# Patient Record
Sex: Female | Born: 1960 | Race: White | Hispanic: No | Smoking: Never smoker
Health system: Southern US, Community
[De-identification: ages and names within clinical notes are randomized; demographics above are authoritative.]

## PROBLEM LIST (undated history)

## (undated) DIAGNOSIS — K219 Gastro-esophageal reflux disease without esophagitis: Secondary | ICD-10-CM

## (undated) DIAGNOSIS — R51 Headache: Secondary | ICD-10-CM

## (undated) HISTORY — PX: CHOLECYSTECTOMY: SHX55

---

## 1997-08-08 ENCOUNTER — Ambulatory Visit (HOSPITAL_COMMUNITY): Admission: RE | Admit: 1997-08-08 | Discharge: 1997-08-08 | Payer: Self-pay | Admitting: Family Medicine

## 1999-09-03 ENCOUNTER — Other Ambulatory Visit: Admission: RE | Admit: 1999-09-03 | Discharge: 1999-09-03 | Payer: Self-pay | Admitting: General Practice

## 2001-10-22 ENCOUNTER — Encounter: Payer: Self-pay | Admitting: Emergency Medicine

## 2001-10-22 ENCOUNTER — Emergency Department (HOSPITAL_COMMUNITY): Admission: EM | Admit: 2001-10-22 | Discharge: 2001-10-22 | Payer: Self-pay | Admitting: Emergency Medicine

## 2001-11-23 ENCOUNTER — Other Ambulatory Visit: Admission: RE | Admit: 2001-11-23 | Discharge: 2001-11-23 | Payer: Self-pay | Admitting: Family Medicine

## 2003-11-18 ENCOUNTER — Ambulatory Visit (HOSPITAL_COMMUNITY): Admission: RE | Admit: 2003-11-18 | Discharge: 2003-11-18 | Payer: Self-pay | Admitting: General Practice

## 2003-11-18 ENCOUNTER — Other Ambulatory Visit: Admission: RE | Admit: 2003-11-18 | Discharge: 2003-11-18 | Payer: Self-pay | Admitting: Obstetrics and Gynecology

## 2004-11-20 ENCOUNTER — Ambulatory Visit (HOSPITAL_COMMUNITY): Admission: RE | Admit: 2004-11-20 | Discharge: 2004-11-20 | Payer: Self-pay | Admitting: Obstetrics and Gynecology

## 2004-11-20 ENCOUNTER — Other Ambulatory Visit: Admission: RE | Admit: 2004-11-20 | Discharge: 2004-11-20 | Payer: Self-pay | Admitting: Obstetrics and Gynecology

## 2005-11-22 ENCOUNTER — Ambulatory Visit (HOSPITAL_COMMUNITY): Admission: RE | Admit: 2005-11-22 | Discharge: 2005-11-22 | Payer: Self-pay | Admitting: Obstetrics and Gynecology

## 2006-09-27 ENCOUNTER — Ambulatory Visit: Payer: Self-pay | Admitting: Gastroenterology

## 2006-09-27 LAB — CONVERTED CEMR LAB
ALT: 12 units/L (ref 0–40)
AST: 14 units/L (ref 0–37)
Albumin: 4.1 g/dL (ref 3.5–5.2)
Alkaline Phosphatase: 49 units/L (ref 39–117)
Amylase: 95 units/L (ref 27–131)
BUN: 6 mg/dL (ref 6–23)
Basophils Absolute: 0 10*3/uL (ref 0.0–0.1)
Basophils Relative: 0.6 % (ref 0.0–1.0)
Bilirubin, Direct: 0.2 mg/dL (ref 0.0–0.3)
CO2: 30 meq/L (ref 19–32)
Calcium: 9.2 mg/dL (ref 8.4–10.5)
Chloride: 108 meq/L (ref 96–112)
Creatinine, Ser: 0.6 mg/dL (ref 0.4–1.2)
Eosinophils Absolute: 0 10*3/uL (ref 0.0–0.6)
Eosinophils Relative: 0.5 % (ref 0.0–5.0)
GFR calc Af Amer: 140 mL/min
GFR calc non Af Amer: 116 mL/min
Glucose, Bld: 93 mg/dL (ref 70–99)
HCT: 39.2 % (ref 36.0–46.0)
Hemoglobin: 13.8 g/dL (ref 12.0–15.0)
Lipase: 28 units/L (ref 11.0–59.0)
Lymphocytes Relative: 22.5 % (ref 12.0–46.0)
MCHC: 35.3 g/dL (ref 30.0–36.0)
MCV: 88.3 fL (ref 78.0–100.0)
Monocytes Absolute: 0.2 10*3/uL (ref 0.2–0.7)
Monocytes Relative: 3.1 % (ref 3.0–11.0)
Neutro Abs: 4.9 10*3/uL (ref 1.4–7.7)
Neutrophils Relative %: 73.3 % (ref 43.0–77.0)
Platelets: 213 10*3/uL (ref 150–400)
Potassium: 4 meq/L (ref 3.5–5.1)
RBC: 4.44 M/uL (ref 3.87–5.11)
RDW: 12.5 % (ref 11.5–14.6)
Sodium: 145 meq/L (ref 135–145)
TSH: 0.11 microintl units/mL — ABNORMAL LOW (ref 0.35–5.50)
Total Bilirubin: 1 mg/dL (ref 0.3–1.2)
Total Protein: 7.5 g/dL (ref 6.0–8.3)
WBC: 6.6 10*3/uL (ref 4.5–10.5)

## 2006-10-04 ENCOUNTER — Ambulatory Visit: Payer: Self-pay | Admitting: Gastroenterology

## 2006-10-11 ENCOUNTER — Ambulatory Visit: Payer: Self-pay | Admitting: Gastroenterology

## 2006-10-11 LAB — CONVERTED CEMR LAB
Free T4: 0.7 ng/dL (ref 0.6–1.6)
T3 Uptake Ratio: 40.5 % — ABNORMAL HIGH (ref 22.5–37.0)
T3, Free: 2.8 pg/mL (ref 2.3–4.2)
T4, Total: 5.7 ug/dL (ref 5.0–12.5)

## 2006-10-18 ENCOUNTER — Ambulatory Visit: Payer: Self-pay | Admitting: Gastroenterology

## 2006-11-15 ENCOUNTER — Ambulatory Visit (HOSPITAL_COMMUNITY): Admission: RE | Admit: 2006-11-15 | Discharge: 2006-11-16 | Payer: Self-pay | Admitting: General Surgery

## 2006-11-15 ENCOUNTER — Encounter (INDEPENDENT_AMBULATORY_CARE_PROVIDER_SITE_OTHER): Payer: Self-pay | Admitting: General Surgery

## 2007-07-07 ENCOUNTER — Emergency Department (HOSPITAL_COMMUNITY): Admission: EM | Admit: 2007-07-07 | Discharge: 2007-07-07 | Payer: Self-pay | Admitting: Emergency Medicine

## 2007-08-19 DIAGNOSIS — R5381 Other malaise: Secondary | ICD-10-CM

## 2007-08-19 DIAGNOSIS — R630 Anorexia: Secondary | ICD-10-CM

## 2007-08-19 DIAGNOSIS — R5383 Other fatigue: Secondary | ICD-10-CM

## 2007-08-19 DIAGNOSIS — R112 Nausea with vomiting, unspecified: Secondary | ICD-10-CM

## 2007-08-19 DIAGNOSIS — G4700 Insomnia, unspecified: Secondary | ICD-10-CM | POA: Insufficient documentation

## 2007-08-19 DIAGNOSIS — F341 Dysthymic disorder: Secondary | ICD-10-CM

## 2007-08-19 DIAGNOSIS — R17 Unspecified jaundice: Secondary | ICD-10-CM | POA: Insufficient documentation

## 2007-08-19 DIAGNOSIS — R1032 Left lower quadrant pain: Secondary | ICD-10-CM

## 2008-09-13 ENCOUNTER — Emergency Department (HOSPITAL_COMMUNITY): Admission: EM | Admit: 2008-09-13 | Discharge: 2008-09-13 | Payer: Self-pay | Admitting: Emergency Medicine

## 2008-09-13 ENCOUNTER — Emergency Department (HOSPITAL_COMMUNITY): Admission: EM | Admit: 2008-09-13 | Discharge: 2008-09-14 | Payer: Self-pay | Admitting: Emergency Medicine

## 2010-08-04 LAB — COMPREHENSIVE METABOLIC PANEL
ALT: 12 U/L (ref 0–35)
AST: 20 U/L (ref 0–37)
Alkaline Phosphatase: 40 U/L (ref 39–117)
Alkaline Phosphatase: 53 U/L (ref 39–117)
BUN: 6 mg/dL (ref 6–23)
CO2: 29 mEq/L (ref 19–32)
CO2: 21 mEq/L (ref 19–32)
Calcium: 8.7 mg/dL (ref 8.4–10.5)
Chloride: 109 mEq/L (ref 96–112)
GFR calc Af Amer: >60 mL/min (ref >60)
GFR calc non Af Amer: >60 mL/min (ref >60)
Glucose, Bld: 97 mg/dL (ref 70–99)
Potassium: 3.6 mEq/L (ref 3.5–5.1)
Potassium: 3.3 mEq/L — ABNORMAL LOW (ref 3.5–5.1)
Sodium: 139 mEq/L (ref 135–145)
Total Bilirubin: 3.1 mg/dL — ABNORMAL HIGH (ref 0.3–1.2)
Total Protein: 6.9 g/dL (ref 6.0–8.3)

## 2010-08-04 LAB — CBC
HCT: 39.1 % (ref 36.0–46.0)
Hemoglobin: 12.9 g/dL (ref 12.0–15.0)
MCHC: 35.5 g/dL (ref 30.0–36.0)
RBC: 4.2 MIL/uL (ref 3.87–5.11)
WBC: 9.3 10*3/uL (ref 4.0–10.5)
WBC: 7.2 10*3/uL (ref 4.0–10.5)

## 2010-08-04 LAB — DIFFERENTIAL
Basophils Absolute: 0 10*3/uL (ref 0.0–0.1)
Basophils Relative: 0 % (ref 0–1)
Eosinophils Absolute: 0 10*3/uL (ref 0.0–0.7)
Eosinophils Relative: 0 % (ref 0–5)
Lymphs Abs: 0.4 10*3/uL — ABNORMAL LOW (ref 0.7–4.0)
Monocytes Absolute: 0.1 10*3/uL (ref 0.1–1.0)
Monocytes Relative: 1 % — ABNORMAL LOW (ref 3–12)
Neutro Abs: 6.8 10*3/uL (ref 1.7–7.7)
Neutrophils Relative %: 94 % — ABNORMAL HIGH (ref 43–77)

## 2010-08-04 LAB — URINALYSIS, ROUTINE W REFLEX MICROSCOPIC
Bilirubin Urine: NEGATIVE
Glucose, UA: NEGATIVE mg/dL
Hgb urine dipstick: NEGATIVE
Ketones, ur: NEGATIVE mg/dL
Ketones, ur: >80 mg/dL — AB
Protein, ur: 30 mg/dL — AB
Protein, ur: NEGATIVE mg/dL
Urobilinogen, UA: 1 mg/dL (ref 0.0–1.0)

## 2010-08-04 LAB — POCT PREGNANCY, URINE: Preg Test, Ur: NEGATIVE

## 2010-08-04 LAB — URINE MICROSCOPIC-ADD ON

## 2010-08-04 LAB — POCT CARDIAC MARKERS: Troponin i, poc: <0.05 ng/mL (ref 0.00–0.09)

## 2010-08-04 LAB — PREGNANCY, URINE: Preg Test, Ur: NEGATIVE

## 2010-08-04 LAB — LIPASE, BLOOD: Lipase: 27 U/L (ref 11–59)

## 2010-09-08 NOTE — Assessment & Plan Note (Signed)
[facility]                         GASTROENTEROLOGY OFFICE NOTE   MADALYN, LEGNER                      MRN:          [id number]  DATE:09/27/2006                            DOB:          02-25-1961    NEW PATIENT EVALUATION/CONSULT   CHIEF COMPLAINT:  Mrs. Takiyah is a 50 year old [place] female referred  for evaluation of abdominal pain and elevated bilirubin on lab testing.   HISTORY OF PRESENT ILLNESS:  Mrs. Sapphire has been in good health all of  her life without serious medical or surgical problems.  Her husband died  unexpectedly 2 months ago and she has had resulting anxiety and  depression with loss of sleep, loss of appetite, anorexia, and general  malaise.  Approximately a week ago, she developed crampy abdominal pain  with nausea and vomiting and a low grade fever.  She was seen by Dr.  Louanna Raw on Sep 07, 2006 and had a normal CBC and metabolic profile,  except for an elevated total bilirubin of 2.7 mg percent.  Apparently,  KUB showed excessive amounts of stool in the abdomen, but otherwise was  normal, as was EKG and chest x-ray.  The patient was placed on  dicyclomine 10 mg t.i.d., which she has been unable to tolerate, also  daily Senokot.   She continues to complain of a crampy lower abdominal pain, some mild  constipation, but no rectal bleeding, no further nausea and vomiting, or  systemic complaints.  She denies any specific hepatobiliary problems  such as clay-colored stools, dark urine, icterus, fever, or chills.  There is no history of hepatitis in the past or known liver disease.  She is not an alcoholic and does not abuse cigarettes or NSAID.  She has  never had to have prior GI evaluations for any reasons.   PAST MEDICAL HISTORY:  The patient otherwise has no medical history what  so ever.  She was born in [place], but has lived the last 10 years in  [place] and [place].  She denies any specific food  intolerances.   FAMILY HISTORY:  Entirely noncontributory.   SOCIAL HISTORY:  The patient is widowed and lives with her children.  She has a high school education.  She does not smoke or use alcohol and  gives no history of IV needle use.   REVIEW OF SYSTEMS:  Otherwise, noncontributory.  She apparently gets  Depo shots and is not menstruating.  She denies any problems with  previous gynecologic difficulties.   EXAM:  She is a healthy-appearing [place] female appearing her stated  age.  She certainly is in no acute distress.  She is 5 feet 3 inches tall and weighs 119 pounds.  Blood pressure is  112/64 and pulse was 68 and regular.  There was no scleral icterus, lymphadenopathy, or thyromegaly.  CHEST:  Entirely clear to percussion and auscultation.  She was in a regular rhythm without murmurs, gallops, or rubs.  I could not appreciate hepatosplenomegaly, abdominal masses, or  tenderness.  Bowel sounds were normal.  Peripheral extremities were unremarkable.  Mental status was clear.  Inspection of  the rectum was unremarkable, as was rectal exam and stool  was formed and guaiac negative.   ASSESSMENT:  1. Crampy abdominal pain with associated constipation, most consistent      with irritable bowel syndrome constipation.  2. New-onset anxiety and depression related to the death of her      husband.  3. 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  4. Rule out cholelithiasis.  5. Rule out associated pancreatitis, which is resolving at this time.   RECOMMENDATIONS:  1. Repeat lab data, including amylase, lipase, and serum trypsin      level.  2. Upper abdominal ultrasound exam.  3. High fiber diet with daily Citrucel and liberal p.o. fluids.  4. MiraLax as needed 8 ounces at bedtime.  5. Will start low dose Celexa 10 mg at bedtime as tolerated.  6. GI followup in 3 weeks' time or p.r.n. as  needed.     Vania Rea. Jarold Motto, MD, Caleen Essex, FAGA  Electronically Signed    DRP/MedQ  DD: 09/27/2006  DT: 09/27/2006  Job #: [id number]   cc:   Louanna Raw

## 2010-09-08 NOTE — Assessment & Plan Note (Signed)
[facility]                         GASTROENTEROLOGY OFFICE NOTE   NAME:Shannon, JORDANE Robertson                      MRN:          [id number]  DATE:10/18/2006                            DOB:          29-Aug-1960    PROBLEM:  Right-sided abdominal pain.   HISTORY:  Shannon Robertson is a 50 year old [place] female, who has been  undergoing evaluation by Dr. Jarold Motto for right-sided abdominal pain.  She says that she is actually doing better this week, was having more  discomfort last week, but continues to have some discomfort in her right  abdomen and into the right back.  Her appetite has been good.  She has  not been having any nausea or vomiting, no fever or chills.  She has  undergone abdominal ultrasound, which shows multiple gallstones and  gallbladder wall thickening, consistent with a chronic cholecystitis.  Common bile duct is normal at 4.8 mm.   MOST RECENT LABS:  Liver function studies were normal on September 27, 2006.  Amylase 95, lipase at 28.  She did have a TSH that was slightly low at  0.11.  T3 of 40, free T4 of 0.7, free T3 of 2.8.   Patient says that she has seen Dr. Lucianne Muss recently for endocrinology and  that he did not feel that she needed to be treated for her thyroid  presently and he will follow her up in September with followup labs, as  well.   CURRENT MEDICATIONS:  None.   ALLERGIES:  No known drug allergies.   EXAM:  A well-developed, [place] female, in no acute distress.  Weight is 120, blood pressure 112/74, pulse is 68.  She is anicteric.  CARDIOVASCULAR:  Regular rate and rhythm with S1 and S2.  PULMONARY:  Clear to A and P.  ABDOMEN:  Soft.  Bowel sounds are active.  She is minimally tender in  the right upper quadrant.  There is no guarding or rebound.   IMPRESSION:  A 50 year old [place] female with right-sided abdominal  pain, consistent with symptomatic cholelithiasis and probable chronic  cholecystitis.   PLAN:  Surgical  referral.  We made her an appointment to see Dr.  Janee Morn on July 14.  She is to call for any problems in the interim.  She did request a pain medication and was given a  prescription for Darvocet N 100 one every 4-6 hours as needed for pain,  #50 and no refills.  She will follow up with Dr. Lucianne Muss as directed in  September and was asked to follow a low-fat diet.      Mike Gip, PA-C  Electronically Signed      Vania Rea. Jarold Motto, MD, Caleen Essex, FAGA  Electronically Signed   AE/MedQ  DD: 10/18/2006  DT: 10/19/2006  Job #: [id number]   cc:   Gabrielle Dare. Janee Morn, M.D.

## 2010-09-08 NOTE — Op Note (Signed)
NAMESHERRIN, STAHLE                    ACCOUNT NO.:  [id number]   MEDICAL RECORD NO.:  [id number]          PATIENT TYPE:  AMB   LOCATION:  SDS                          FACILITY:  [facility]   PHYSICIAN:  Gabrielle Dare. Janee Morn, M.D.DATE OF BIRTH:  25-Aug-1960   DATE OF PROCEDURE:  11/15/2006  DATE OF DISCHARGE:                               OPERATIVE REPORT   PREOPERATIVE DIAGNOSIS:  Symptomatic cholelithiasis.   POSTOPERATIVE DIAGNOSIS:  Symptomatic cholelithiasis.   PROCEDURE:  Laparoscopic cholecystectomy with intraoperative  cholangiogram.   SURGEON:  Gabrielle Dare. Janee Morn, M.D.   ASSISTANT:  Ollen Gross. Vernell Morgans, M.D.   ANESTHESIA:  General.   HISTORY OF PRESENT ILLNESS:  The patient is a 50 year old [place]  [place] female who I evaluated in the office for episodic right upper  quadrant pain.  Workup had included an ultrasound showing multiple  gallstones.  She presents today for elective cholecystectomy.   PROCEDURE IN DETAIL:  Informed consent was obtained.  The patient  received intravenous antibiotics.  She was identified in the  preoperative holding area.  She was brought to the operating room.  General anesthesia was administered by the anesthesia staff.  Her  abdomen was prepped and draped in a sterile fashion.  The infraumbilical  region was infiltrated with 0.25% Marcaine with epinephrine and  infraumbilical incision was made.  Subcutaneous tissues were dissected  down revealing the anterior fascia.  This was divided sharply.  The  peritoneal cavity was then entered under direct vision without  difficulty.  A 0 Vicryl pursestring suture was placed around the fascial  opening and the Hasson trocar was inserted into the abdomen.  The  abdomen was insufflated with CO2 in a standard fashion.  Under direct  vision, an 11-mm epigastric, and two 5 mm lateral ports were placed.  The 0.25% Marcaine with epinephrine was used at all port sites.  Dome of  the gallbladder was then retracted  superomedially .  the infundibulum  was retracted inferolaterally.  Visualization of the anatomy was  excellent.  Dissection began laterally and progressed medially, easily  identifying the cystic duct.  We could also see the common bile duct  easily.  The cystic duct was dissected at the infundibulum  circumferentially until al large window was created between the  infundibulum, the cystic duct and the liver.  Once this was done with  excellent visualization, a clip was placed on the infundibulocystic duct  junction.  A small nick was placed on the cystic duct and a red  cholangiogram catheter was inserted.  Intraoperative cholangiogram was  then obtained.  This demonstrated no common bile duct filling defects  and easy flow of contrast into the duodenum.  The cholangiogram catheter  was removed.  Three clips were placed proximally on the cystic duct and  it was divided.  Further dissection revealed the cystic artery was  clipped twice proximally and once distally and divided.  The gallbladder  was taken off the liver bed with a Bovie cautery.  We did encountered a  small posterior branch of the cystic artery.  This was also clipped  twice proximally and divided distally with cautery.  The gallbladder was  placed in an EndoCatch bag and removed from the abdomen via the  infraumbilical port site.  The liver bed was copiously irrigated.  Meticulous hemostasis was ensured.  The liver bed was dried.  The clips  remained in excellent position.  The irrigation fluid was evacuated and  it was clear.  The liver bed was rechecked and remained dry.  The Hasson  trocar was removed and the infraumbilical fascia was closed under direct  vision by tying the 0 Vicryl pursestring suture with care not to trap  any intra-abdominal contents.  The other ports were removed under direct  vision.  The pneumoperitoneum was released and all four wounds were  copiously irrigated and the skin of each was closed  with a running 4-0  Vicryl subcuticular stitch.  Sponge, needle, and instrument counts were  correct.  Benzoin, Steri-Strips and sterile dressings were applied.  The  patient tolerated the procedure well without apparent complication, and  was taken to the recovery room in stable condition.      Gabrielle Dare Janee Morn, M.D.  Electronically Signed     BET/MEDQ  D:  11/15/2006  T:  11/15/2006  Job:  [id number]   cc:   Vania Rea. Jarold Motto, MD, Caleen Essex, FAGA

## 2010-12-06 IMAGING — US US ABDOMEN COMPLETE
1 series · 14 of 25 positions shown · non-contrast
Comparison: None

CLINICAL DATA: Abdominal pain

COMPLETE ABDOMINAL ULTRASOUND

[Series 1: us abdomen complete · 0.32mm/px · 14 of 42 slices shown]
[im 1/42]
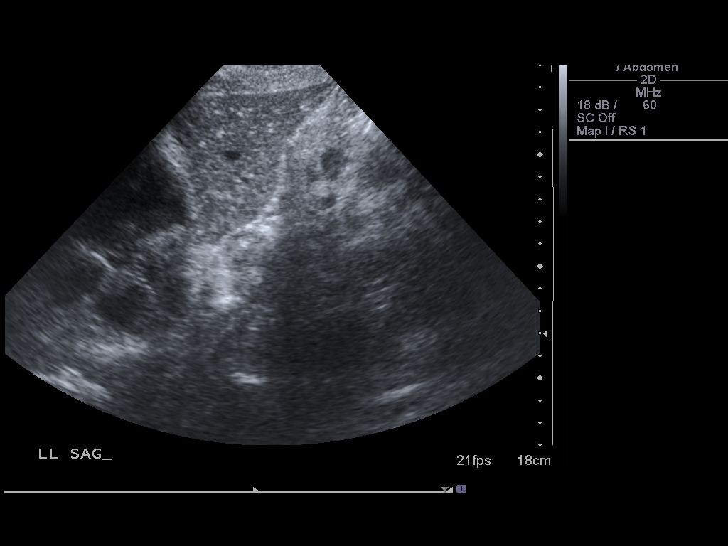
[im 4/42]
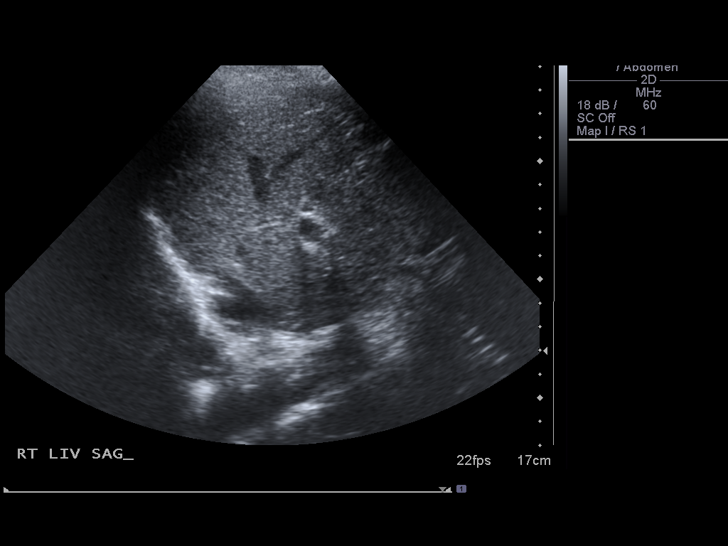
[im 7/42]
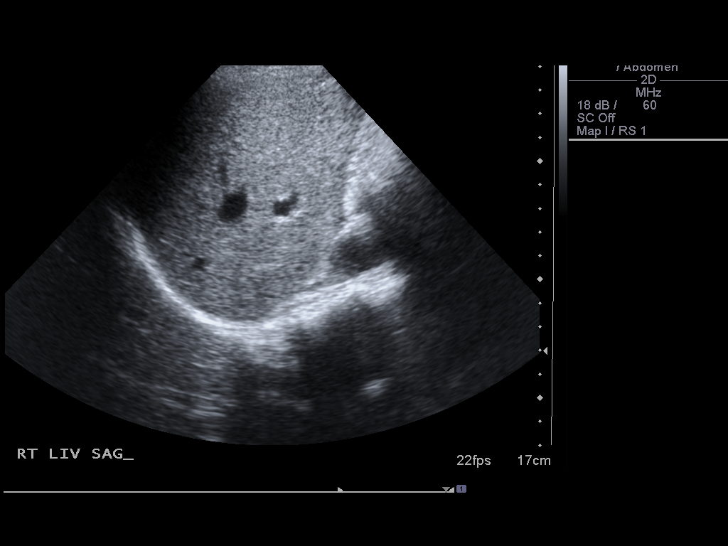
[im 11/42]
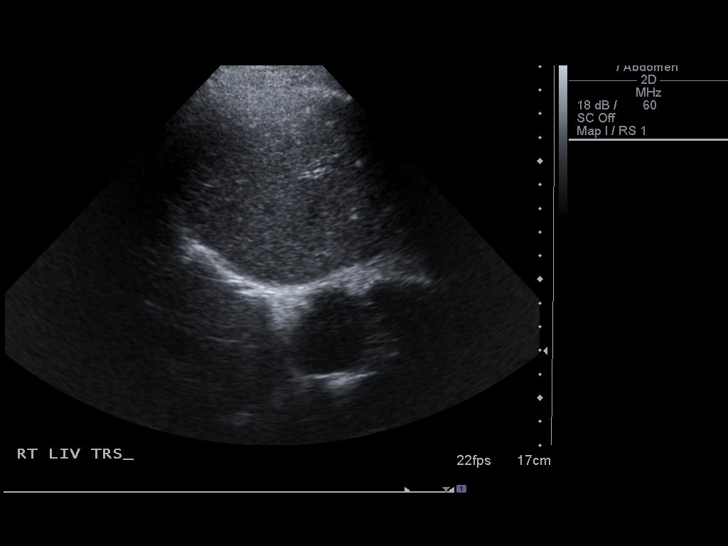
[im 14/42]
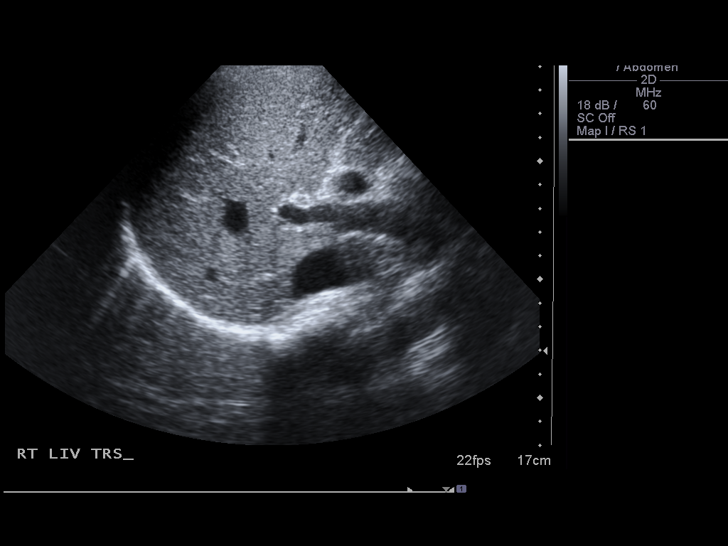
[im 16/42]
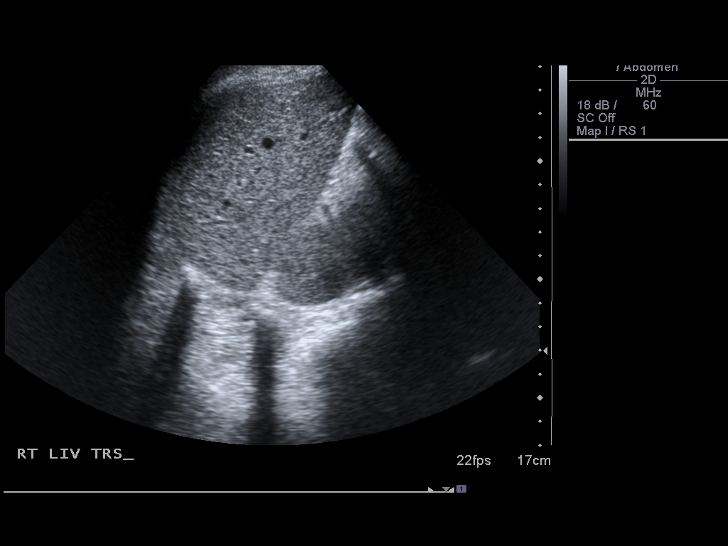
[im 19/42]
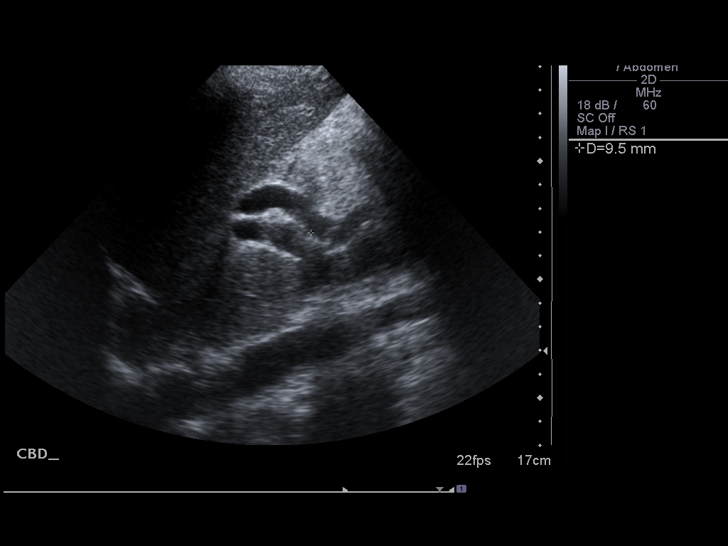
[im 23/42]
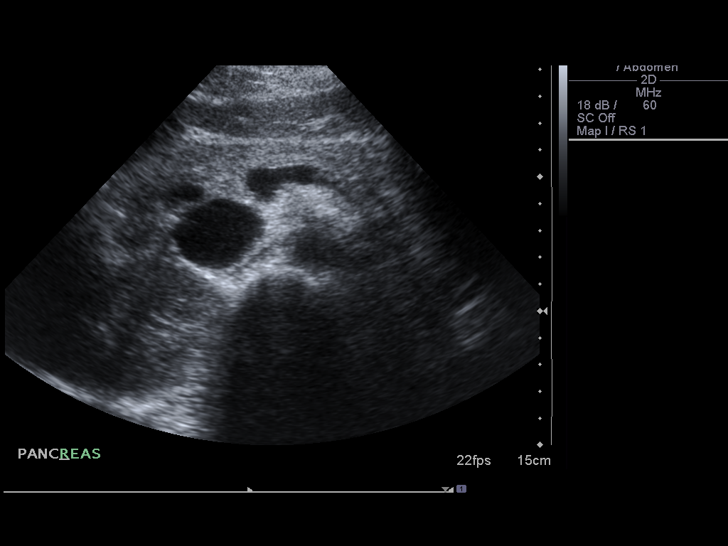
[im 26/42]
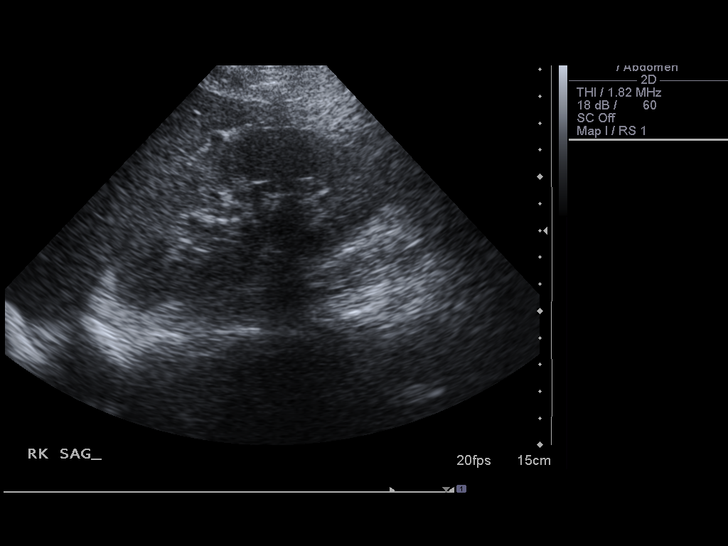
[im 28/42]
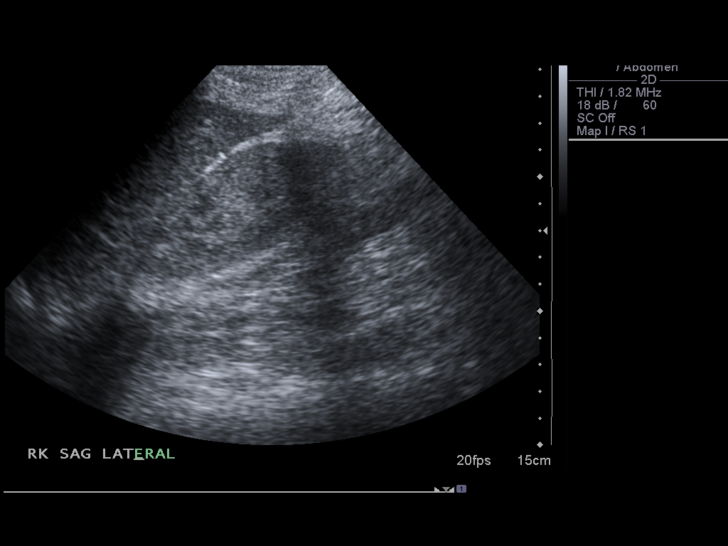
[im 31/42]
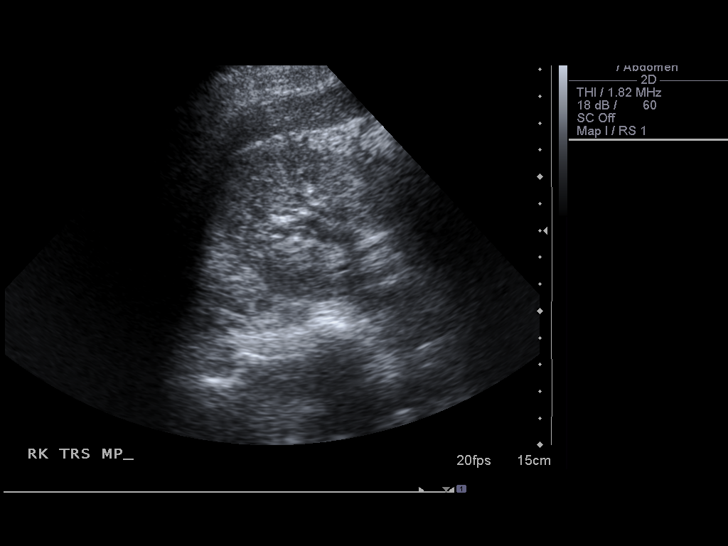
[im 35/42]
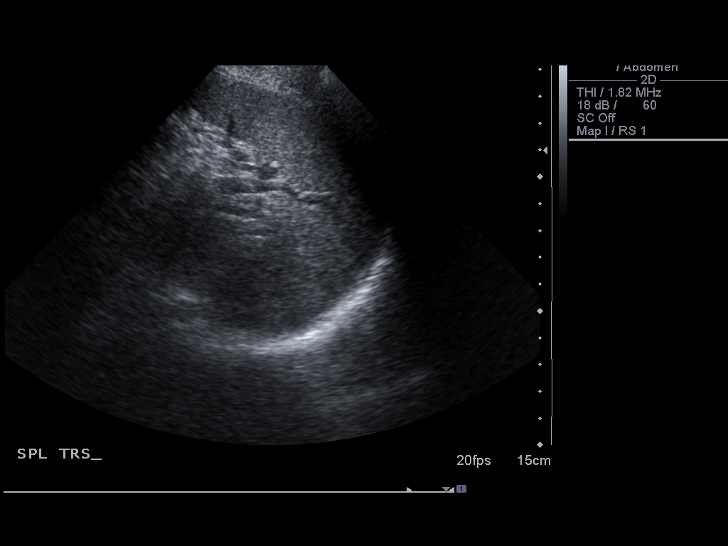
[im 38/42]
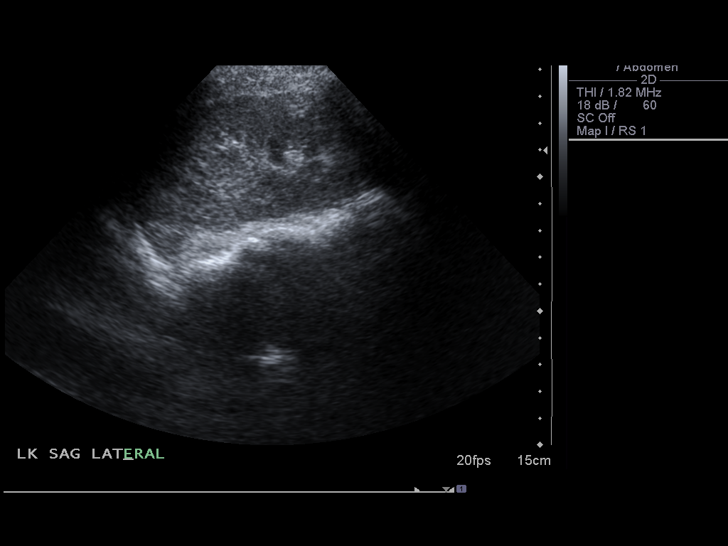
[im 42/42]
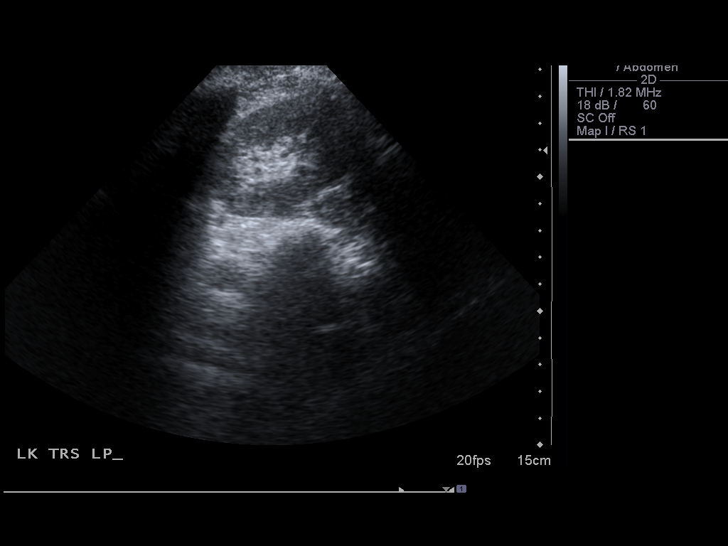

[14 of 25 positions shown; findings below may reference images not displayed]

FINDINGS: Gallbladder:  Prior cholecystectomy.

Common bile duct:  Mildly prominent at 9.5 mm, likely related to
post cholecystectomy state.  No intrahepatic biliary ductal
dilatation.

Liver:  Normal size and echotexture.  No focal abnormality.

IVC:  Patent.

Pancreas:  Normal size and echotexture.  No focal abnormality.

Spleen:  Normal size and echotexture.  No focal abnormality.

Right Kidney:  Normal size and echotexture.  No focal abnormality.
No hydronephrosis.

Left Kidney:  Normal size and echotexture.  No focal abnormality.
No hydronephrosis.

Abdominal aorta:  Normal caliber.

Other Findings:  None.
IMPRESSION: Prior cholecystectomy.  No acute findings in the abdomen.

## 2010-12-06 IMAGING — CR DG CHEST 2V
1 series · 1 of 1 positions shown · non-contrast
Comparison: None

CLINICAL DATA: Syncope.

CHEST - 2 VIEW

[view not recorded]
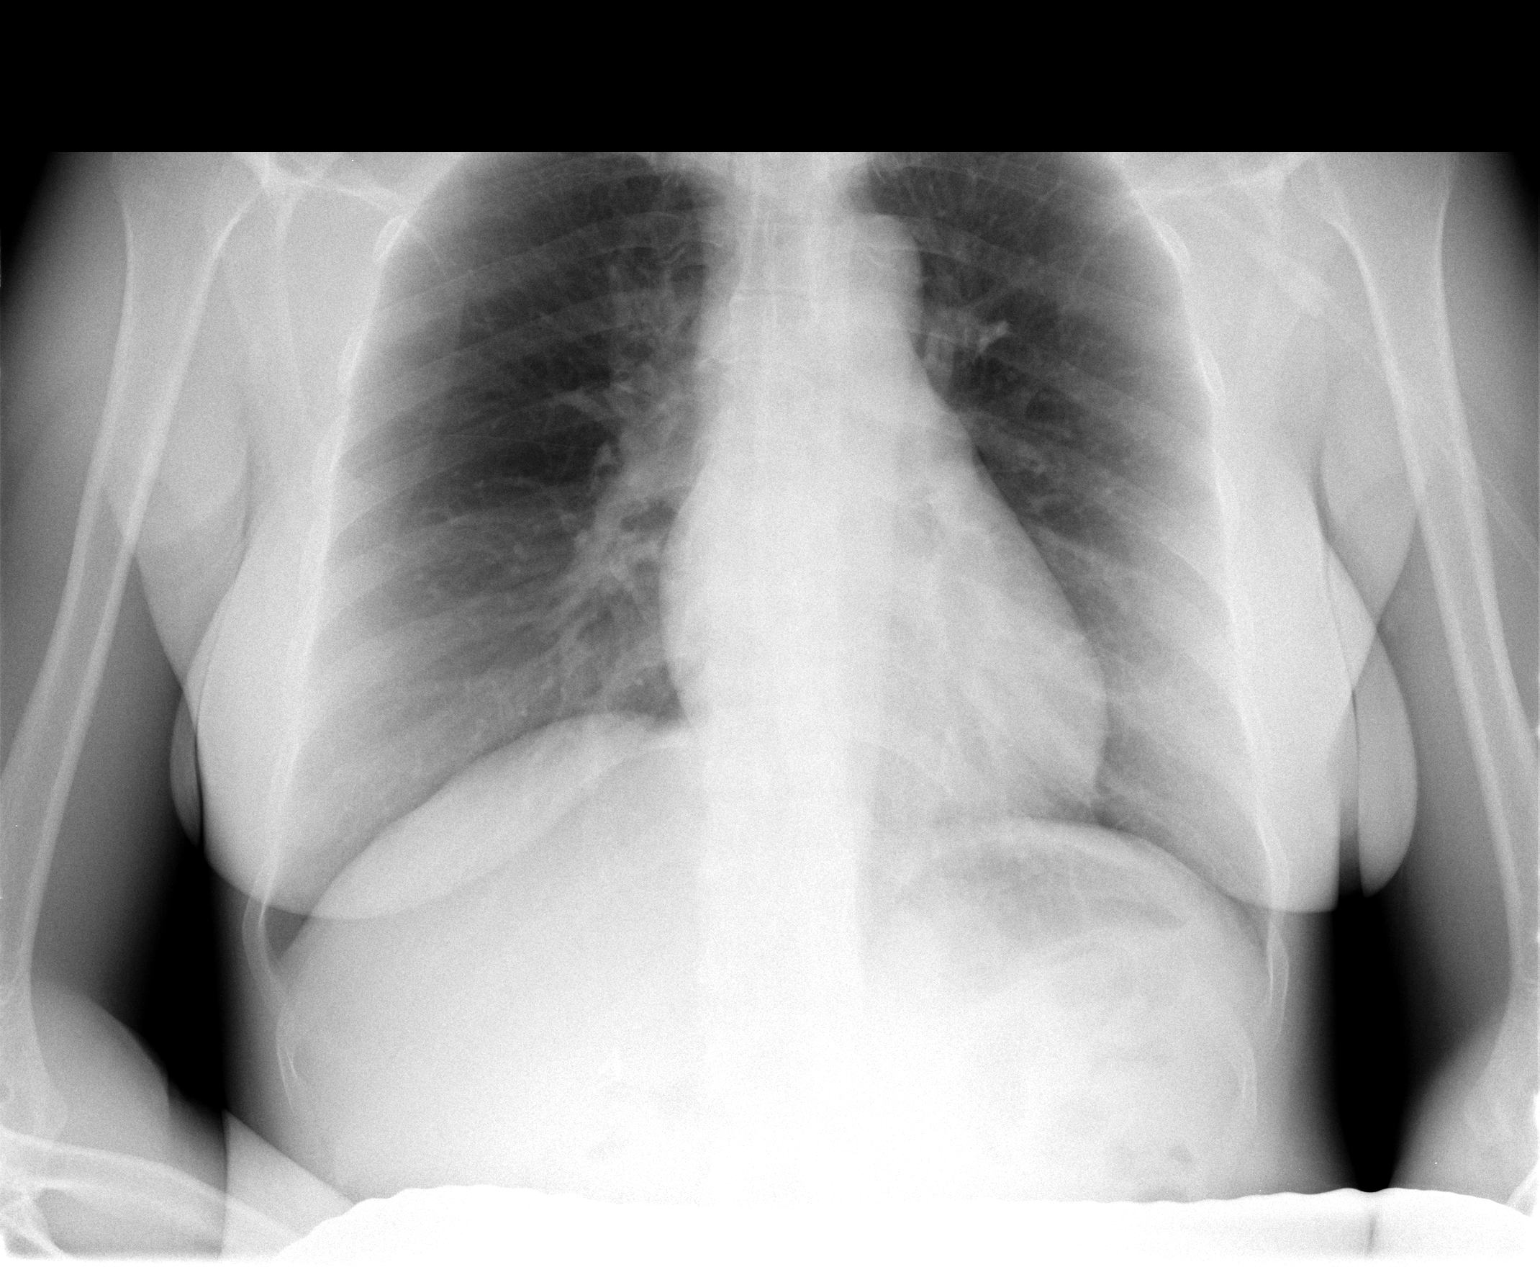

[1 of 1 positions shown; findings below may reference images not displayed]

FINDINGS: Heart and mediastinal contours are within normal limits.
No focal opacities or effusions.  No acute bony abnormality.
IMPRESSION: No active disease.

## 2011-02-08 LAB — CBC
HCT: 41
Hemoglobin: 13.8
RBC: 4.61
RDW: 12.5

## 2012-04-26 ENCOUNTER — Encounter (HOSPITAL_COMMUNITY): Payer: Self-pay | Admitting: *Deleted

## 2012-04-26 ENCOUNTER — Emergency Department (HOSPITAL_COMMUNITY)
Admission: EM | Admit: 2012-04-26 | Discharge: 2012-04-26 | Disposition: A | Discharge status: Home / Self Care | Payer: 59 | Source: Home / Self Care | Attending: Family Medicine | Admitting: Family Medicine

## 2012-04-26 DIAGNOSIS — K2970 Gastritis, unspecified, without bleeding: Secondary | ICD-10-CM

## 2012-04-26 DIAGNOSIS — K2990 Gastroduodenitis, unspecified, without bleeding: Secondary | ICD-10-CM

## 2012-04-26 MED ORDER — ONDANSETRON 4 MG PO TBDP
4.0000 mg | ORAL_TABLET | Freq: Once | ORAL | Status: AC
  Administered 2012-04-26: 4 mg via ORAL

## 2012-04-26 MED ORDER — GI COCKTAIL ~~LOC~~
30.0000 mL | Freq: Once | ORAL | Status: AC
  Administered 2012-04-26: 30 mL via ORAL

## 2012-04-26 MED ORDER — ONDANSETRON 4 MG PO TBDP
ORAL_TABLET | ORAL | Status: AC
  Filled 2012-04-26: qty 1

## 2012-04-26 MED ORDER — PANTOPRAZOLE SODIUM 40 MG PO TBEC: 40.0000 mg | DELAYED_RELEASE_TABLET | Freq: Every day | ORAL | Status: DC

## 2012-04-26 MED ORDER — GI COCKTAIL ~~LOC~~
ORAL | Status: AC
  Filled 2012-04-26: qty 30

## 2012-04-26 NOTE — ED Provider Notes (Signed)
History     CSN: [id number]  Arrival date & time 04/26/12  1332   First MD Initiated Contact with Patient 04/26/12 1457      Chief Complaint  Patient presents with  . Abdominal Pain    (Consider location/radiation/quality/duration/timing/severity/associated sxs/prior treatment) Patient is a 52 y.o. female presenting with abdominal pain. The history is provided by the patient and the spouse.  Abdominal Pain The primary symptoms of the illness include abdominal pain. The primary symptoms of the illness do not include fever, shortness of breath, nausea, vomiting or diarrhea. The current episode started more than 2 days ago. The onset of the illness was gradual. The problem has been gradually worsening.  Additional symptoms associated with the illness include heartburn. Associated symptoms comments: Bloating, burning , gas.after taking pred for rash..    History reviewed. No pertinent past medical history.  Past Surgical History  Procedure Date  . Cholecystectomy     No family history on file.  History  Substance Use Topics  . Smoking status: Never Smoker   . Smokeless tobacco: Not on file  . Alcohol Use: No    OB History    Grav Para Term Preterm Abortions TAB SAB Ect Mult Living                  Review of Systems  Constitutional: Negative for fever.  Respiratory: Negative for shortness of breath.   Gastrointestinal: Positive for heartburn, abdominal pain and abdominal distention. Negative for nausea, vomiting and diarrhea.  Skin: Positive for rash.    Allergies  Review of patient's allergies indicates no known allergies.  Home Medications   Current Outpatient Rx  Name  Route  Sig  Dispense  Refill  . PREDNISONE PO   Oral   Take by mouth.         Marland Kitchen PANTOPRAZOLE SODIUM 40 MG PO TBEC   Oral   Take 1 tablet (40 mg total) by mouth daily.   30 tablet   1     BP 170/88  Pulse 78  Temp 98.6 F (37 C) (Oral)  Resp 14  SpO2 10%  Physical Exam  Nursing  note and vitals reviewed. Constitutional: She is oriented to person, place, and time. She appears well-developed and well-nourished.  Eyes: Conjunctivae normal are normal. Pupils are equal, round, and reactive to light.  Neck: Normal range of motion. Neck supple.  Cardiovascular: Normal rate, regular rhythm, normal heart sounds and intact distal pulses.   Pulmonary/Chest: Effort normal and breath sounds normal.  Abdominal: Soft. Bowel sounds are normal. She exhibits distension. She exhibits no mass. There is tenderness. There is no rebound and no guarding.  Lymphadenopathy:    She has no cervical adenopathy.  Neurological: She is alert and oriented to person, place, and time.  Skin: Skin is warm and dry. Rash noted. There is erythema.    ED Course  Procedures (including critical care time)  Labs Reviewed - No data to display No results found.   1. Gastritis and duodenitis       MDM          Linna Hoff, MD 04/26/12 1524

## 2012-04-26 NOTE — ED Notes (Signed)
C/o  abd  Bloating   And   Pain  With  Nausea       Since yest   Has been taking  Prednisone         For  A  Residual       Rash    Sensation of  Bloating  And   Nausea  As  Well  -  The  Pt  Reports  Some  Loose  Stool         No  Vomiting         denys  Any  Vaginal bleeding or  Discharge

## 2012-04-30 ENCOUNTER — Emergency Department (HOSPITAL_BASED_OUTPATIENT_CLINIC_OR_DEPARTMENT_OTHER)
Admission: EM | Admit: 2012-04-30 | Discharge: 2012-04-30 | Disposition: A | Discharge status: Home / Self Care | Payer: 59 | Attending: Emergency Medicine | Admitting: Emergency Medicine

## 2012-04-30 ENCOUNTER — Encounter (HOSPITAL_BASED_OUTPATIENT_CLINIC_OR_DEPARTMENT_OTHER): Payer: Self-pay | Admitting: Emergency Medicine

## 2012-04-30 DIAGNOSIS — R21 Rash and other nonspecific skin eruption: Secondary | ICD-10-CM | POA: Insufficient documentation

## 2012-04-30 DIAGNOSIS — R231 Pallor: Secondary | ICD-10-CM | POA: Insufficient documentation

## 2012-04-30 DIAGNOSIS — L299 Pruritus, unspecified: Secondary | ICD-10-CM | POA: Insufficient documentation

## 2012-04-30 DIAGNOSIS — L500 Allergic urticaria: Secondary | ICD-10-CM | POA: Insufficient documentation

## 2012-04-30 DIAGNOSIS — L259 Unspecified contact dermatitis, unspecified cause: Secondary | ICD-10-CM | POA: Insufficient documentation

## 2012-04-30 DIAGNOSIS — T7840XA Allergy, unspecified, initial encounter: Secondary | ICD-10-CM

## 2012-04-30 MED ORDER — HYDROXYZINE HCL 25 MG PO TABS: 25.0000 mg | ORAL_TABLET | Freq: Four times a day (QID) | ORAL | Status: DC

## 2012-04-30 MED ORDER — FAMOTIDINE 20 MG PO TABS: 20.0000 mg | ORAL_TABLET | Freq: Two times a day (BID) | ORAL | Status: DC

## 2012-04-30 MED ORDER — FAMOTIDINE 20 MG PO TABS
20.0000 mg | ORAL_TABLET | Freq: Once | ORAL | Status: AC
  Administered 2012-04-30: 20 mg via ORAL
  Filled 2012-04-30: qty 1

## 2012-04-30 MED ORDER — DIPHENHYDRAMINE HCL 25 MG PO CAPS
25.0000 mg | ORAL_CAPSULE | Freq: Once | ORAL | Status: AC
  Administered 2012-04-30: 25 mg via ORAL
  Filled 2012-04-30: qty 1

## 2012-04-30 NOTE — ED Notes (Signed)
Pt having generalized rash since 04-03-13.  Pt has been seen x 3 and treated but rash continues.  Rash seems to get better then worse.  Pt states she has appointment with dermatologist but not until jan 27.    Pt is having itching and rash.

## 2012-04-30 NOTE — ED Provider Notes (Signed)
History     CSN: [id number]  Arrival date & time 04/30/12  1115   First MD Initiated Contact with Patient 04/30/12 1224      Chief Complaint  Patient presents with  . Rash    (Consider location/radiation/quality/duration/timing/severity/associated sxs/prior treatment) HPI Comments: Patient presents with a chief complaint of rash.  She reports that the rash has been intermittent over the past month.  She has seen her PCP on three different occasions for this same rash.  Her PCP felt that it was an allergic reaction and referred her to an Allergist.  Patient has an appt with Allergist on 05-22-12.  She has been taking Prednisone and Claritin for her symptoms, but does not feel that it helped.  She took 60 mg Prednisone just prior to arrival in the ED.  She denies shortness of breath.  Denies swelling of the throat, lips, or tongue.    Patient is a 52 y.o. female presenting with rash. The history is provided by the patient.  Rash  This is a recurrent problem. Associated with: unknown.  No new medications, lotions, soaps, or detergents. There has been no fever. The rash is present on the right arm, left arm, back, neck, left upper leg, left lower leg, right upper leg and right lower leg. The patient is experiencing no pain. Associated symptoms include itching. Pertinent negatives include no blisters, no pain and no weeping.    No past medical history on file.  Past Surgical History  Procedure Date  . Cholecystectomy     No family history on file.  History  Substance Use Topics  . Smoking status: Never Smoker   . Smokeless tobacco: Not on file  . Alcohol Use: No    OB History    Grav Para Term Preterm Abortions TAB SAB Ect Mult Living                  Review of Systems  Constitutional: Negative for fever and chills.  HENT: Negative for facial swelling.        No swelling of the lips, tongue, or throat  Respiratory: Negative for shortness of breath.   Gastrointestinal:  Negative for nausea and vomiting.  Skin: Positive for itching, pallor and rash.  All other systems reviewed and are negative.    Allergies  Review of patient's allergies indicates no known allergies.  Home Medications   Current Outpatient Rx  Name  Route  Sig  Dispense  Refill  . PANTOPRAZOLE SODIUM 40 MG PO TBEC   Oral   Take 1 tablet (40 mg total) by mouth daily.   30 tablet   1   . PREDNISONE PO   Oral   Take by mouth.           BP 133/84  Pulse 95  Temp 97.9 F (36.6 C) (Oral)  Resp 20  Ht 5\' 3"  (1.6 m)  Wt 138 lb (62.596 kg)  BMI 24.45 kg/m2  SpO2 100%  Physical Exam  Nursing note and vitals reviewed. Constitutional: She is oriented to person, place, and time. She appears well-developed and well-nourished. No distress.  HENT:  Head: Normocephalic and atraumatic.  Mouth/Throat: Oropharynx is clear and moist and mucous membranes are normal.       No sign of airway obstruction. No edema of face, eyelids, lips, tongue, uvula.Marland Kitchen Uvula midline, no nasal congestion or drooling.  Tongue not elevated. No trismus.  Neck: Trachea normal, normal range of motion and full passive range of motion  without pain. Neck supple.       No stridor  Cardiovascular: Normal rate, regular rhythm, normal heart sounds, intact distal pulses and normal pulses.        Not tachycardic  Pulmonary/Chest: Effort normal and breath sounds normal. No stridor.  Musculoskeletal: Normal range of motion.  Neurological: She is alert and oriented to person, place, and time.  Skin: Skin is warm and intact. Rash noted. Rash is urticarial. She is not diaphoretic.       Not diaphoretic. Raised erythematous welts, pruritic in nature, located on both legs, both arms, and back. Blanchable urticaria, no petechiae or purpura.   Psychiatric: She has a normal mood and affect. Her behavior is normal.    ED Course  Procedures (including critical care time)  Labs Reviewed - No data to display No results  found.   No diagnosis found.    MDM  Patient re-evaluated prior to dc, is hemodynamically stable, in no respiratory distress, and denies the feeling of throat closing. Pt has been advised to take OTC benadryl & return to the ED if they have a mod-severe allergic rxn (s/s including throat closing, difficulty breathing, swelling of lips face or tongue). Pt is to follow up with Allergist as scheduled. Pt is agreeable with plan & verbalizes understanding.        Pascal Lux [place], PA-C 05/01/12 2209

## 2012-05-04 NOTE — ED Provider Notes (Signed)
Medical screening examination/treatment/procedure(s) were performed by non-physician practitioner and as supervising physician I was immediately available for consultation/collaboration.   Rolan Bucco, MD 05/04/12 [phone]

## 2013-12-12 ENCOUNTER — Ambulatory Visit (HOSPITAL_COMMUNITY)
Admission: RE | Admit: 2013-12-12 | Discharge: 2013-12-12 | Disposition: A | Discharge status: Home / Self Care | Payer: 59 | Source: Ambulatory Visit | Attending: Gastroenterology | Admitting: Gastroenterology

## 2013-12-12 ENCOUNTER — Encounter (HOSPITAL_COMMUNITY)
Admission: RE | Disposition: A | Discharge status: Home / Self Care | Payer: Self-pay | Source: Ambulatory Visit | Attending: Gastroenterology

## 2013-12-12 ENCOUNTER — Encounter (HOSPITAL_COMMUNITY): Payer: Self-pay | Admitting: *Deleted

## 2013-12-12 DIAGNOSIS — F341 Dysthymic disorder: Secondary | ICD-10-CM | POA: Diagnosis not present

## 2013-12-12 DIAGNOSIS — G4700 Insomnia, unspecified: Secondary | ICD-10-CM | POA: Insufficient documentation

## 2013-12-12 DIAGNOSIS — D3A026 Benign carcinoid tumor of the rectum: Secondary | ICD-10-CM | POA: Insufficient documentation

## 2013-12-12 DIAGNOSIS — R1032 Left lower quadrant pain: Secondary | ICD-10-CM | POA: Insufficient documentation

## 2013-12-12 DIAGNOSIS — R112 Nausea with vomiting, unspecified: Secondary | ICD-10-CM | POA: Diagnosis not present

## 2013-12-12 DIAGNOSIS — R17 Unspecified jaundice: Secondary | ICD-10-CM | POA: Diagnosis not present

## 2013-12-12 HISTORY — DX: Headache: R51

## 2013-12-12 HISTORY — PX: RECTAL ULTRASOUND: SHX2306

## 2013-12-12 HISTORY — DX: Gastro-esophageal reflux disease without esophagitis: K21.9

## 2013-12-12 SURGERY — US RECTUM
Anesthesia: Moderate Sedation

## 2013-12-12 MED ORDER — FENTANYL CITRATE 0.05 MG/ML IJ SOLN
INTRAMUSCULAR | Status: AC
  Filled 2013-12-12: qty 2

## 2013-12-12 MED ORDER — MIDAZOLAM HCL 10 MG/2ML IJ SOLN
INTRAMUSCULAR | Status: DC | PRN
  Administered 2013-12-12: 2 mg via INTRAVENOUS
  Administered 2013-12-12: 1 mg via INTRAVENOUS

## 2013-12-12 MED ORDER — FENTANYL CITRATE 0.05 MG/ML IJ SOLN
INTRAMUSCULAR | Status: DC | PRN
  Administered 2013-12-12: 25 ug via INTRAVENOUS

## 2013-12-12 MED ORDER — LACTATED RINGERS IV SOLN: INTRAVENOUS | Status: DC

## 2013-12-12 MED ORDER — MIDAZOLAM HCL 10 MG/2ML IJ SOLN
INTRAMUSCULAR | Status: AC
  Filled 2013-12-12: qty 2

## 2013-12-12 NOTE — Op Note (Signed)
[facility] [place], [id number]   OPERATIVE PROCEDURE REPORT  PATIENT: Shannon Robertson, Shannon Robertson  MR#: [id number] BIRTHDATE: 11-27-1960  GENDER: Female ENDOSCOPIST: Arta Silence, MD REFERRED BY:  Teena Irani, M.D. PROCEDURE DATE:  12/12/2013 PROCEDURE:   Flexible sigmoidoscopy EUS ASA CLASS:   Class I INDICATIONS:1.  rectal carcinoid. MEDICATIONS: Fentanyl 25 mcg IV and Versed 3 mg IV  DESCRIPTION OF PROCEDURE:   After the risks benefits and alternatives of the procedure were thoroughly explained, informed consent was obtained.  Throughout the procedure, the patients blood pressure, pulse and oxygen saturations were monitored continuously. Under direct visualization, the forward-viewing radial echoendoscope was introduced through the anus  and advanced to the  mid sigmoid colon .  Water was used as necessary to provide an acoustic interface.  Imaging was obtained at 7.5 and 12Mhz. Upon completion of the imaging, water was removed and the patient was sent to the recovery room in satisfactory condition.    FINDINGS:   Normal digital rectal exam.  Prep quality good to extent of exam (mid sigmoid colon).  Polypectomy ulcer from recent rectal polypectomy was readily appreciated endoscopically.  Water was subsequently instilled into the rectum to facilitate acoustic coupling.  No evidence of residual lesional tissue was seen either endoscopically or ultrasonographically.  Normal rectal wall.  No evidence of intramural pathology.  No perirectal adenopathy.  ENDOSCOPIC IMPRESSION: Prior rectal polypectomy, with pathology showing rectal carcinoid. No evidence of residual lesional carcinoid tissue was appreciated.  RECOMMENDATIONS: 1.  Watch for potential complications of procedure. 2.  Repeat routine surveillance colonoscopy (adenomatous polyps were seen) in 5 years; don't see need for any more expedited evaluation of the carcinoid polypectomy site, in  absence of new/interval symptoms. 3.  Will discuss with Dr. Amedeo Plenty.   _______________________________ Lorrin MaisArta Silence, MD 12/12/2013 2:20 PM   CC:

## 2013-12-12 NOTE — Discharge Instructions (Signed)
Flexible sigmoidoscopy with ultrasound (endorectal ultrasound)  Post procedure instructions:  Read the instructions outlined below and refer to this sheet in the next few weeks. These discharge instructions provide you with general information on caring for yourself after you leave the hospital. Your doctor may also give you specific instructions. While your treatment has been planned according to the most current medical practices available, unavoidable complications occasionally occur. If you have any problems or questions after discharge, call Dr. Paulita Fujita at [facility] [phone]).  HOME CARE INSTRUCTIONS  ACTIVITY:  You may resume your regular activity, but move at a slower pace for the next 24 hours.   Take frequent rest periods for the next 24 hours.   Walking will help get rid of the air and reduce the bloated feeling in your belly (abdomen).   No driving for 24 hours (because of the medicine (anesthesia) used during the test).   You may shower.   Do not sign any important legal documents or operate any machinery for 24 hours (because of the anesthesia used during the test).  NUTRITION:  Drink plenty of fluids.   You may resume your normal diet as instructed by your doctor.   Begin with a light meal and progress to your normal diet. Heavy or fried foods are harder to digest and may make you feel sick to your stomach (nauseated).   Avoid alcoholic beverages for 24 hours or as instructed.  MEDICATIONS:  You may resume your normal medications unless your doctor tells you otherwise.  WHAT TO EXPECT TODAY:  Some feelings of bloating in the abdomen.   Passage of more gas than usual.   Spotting of blood in your stool or on the toilet paper.  IF YOU HAD POLYPS REMOVED DURING THE COLONOSCOPY:  No aspirin products for 7 days or as instructed.   No alcohol for 7 days or as instructed.   Eat a soft diet for the next 24 hours.   FINDING OUT THE RESULTS OF YOUR  TEST  Not all test results are available during your visit. If your test results are not back during the visit, make an appointment with your caregiver to find out the results. Do not assume everything is normal if you have not heard from your caregiver or the medical facility. It is important for you to follow up on all of your test results.     SEEK IMMEDIATE MEDICAL CARE IF:   You have more than a spotting of blood in your stool.   Your belly is swollen (abdominal distention).   You are nauseated or vomiting.   You have a fever.   You have abdominal pain or discomfort that is severe or gets worse throughout the day.    Document Released: 11/25/2003 Document Revised: 12/23/2010 Document Reviewed: 11/23/2007 [facility] Patient Information 2012 [place].

## 2013-12-12 NOTE — H&P (Signed)
Patient interval history reviewed.  Patient examined again.  There has been no change from documented H/P dated 11/22/13 (scanned into chart from our office) except as documented above.  Assessment:  1.  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  Plan:  1.  Endorectal ultrasound for assessment of polypectomy scar, and consideration of biopsies of polypectomy base. 2.  Risks (bleeding, infection, bowel perforation that could require surgery, sedation-related changes in cardiopulmonary systems), benefits (identification and possible treatment of source of symptoms, exclusion of certain causes of symptoms), and alternatives (watchful waiting, radiographic imaging studies, empiric medical treatment) of endorectal ultrasound were explained to patient/family in detail and patient wishes to proceed.

## 2013-12-13 ENCOUNTER — Encounter (HOSPITAL_COMMUNITY): Payer: Self-pay | Admitting: Gastroenterology

## 2018-11-23 ENCOUNTER — Emergency Department (HOSPITAL_COMMUNITY)
Admission: EM | Admit: 2018-11-23 | Discharge: 2018-11-24 | Disposition: A | Discharge status: Home / Self Care | Payer: 59 | Attending: Emergency Medicine | Admitting: Emergency Medicine

## 2018-11-23 DIAGNOSIS — S61411A Laceration without foreign body of right hand, initial encounter: Secondary | ICD-10-CM | POA: Diagnosis not present

## 2018-11-23 DIAGNOSIS — Z23 Encounter for immunization: Secondary | ICD-10-CM | POA: Insufficient documentation

## 2018-11-23 DIAGNOSIS — R55 Syncope and collapse: Secondary | ICD-10-CM | POA: Diagnosis not present

## 2018-11-23 DIAGNOSIS — Y999 Unspecified external cause status: Secondary | ICD-10-CM | POA: Diagnosis not present

## 2018-11-23 DIAGNOSIS — Z79899 Other long term (current) drug therapy: Secondary | ICD-10-CM | POA: Insufficient documentation

## 2018-11-23 DIAGNOSIS — Y93G1 Activity, food preparation and clean up: Secondary | ICD-10-CM | POA: Insufficient documentation

## 2018-11-23 DIAGNOSIS — Y92010 Kitchen of single-family (private) house as the place of occurrence of the external cause: Secondary | ICD-10-CM | POA: Insufficient documentation

## 2018-11-23 DIAGNOSIS — W25XXXA Contact with sharp glass, initial encounter: Secondary | ICD-10-CM | POA: Insufficient documentation

## 2018-11-23 LAB — CBC WITH DIFFERENTIAL/PLATELET
Abs Immature Granulocytes: 0.01 10*3/uL (ref 0.00–0.07)
Basophils Absolute: 0.1 10*3/uL (ref 0.0–0.1)
Basophils Relative: 1 %
Eosinophils Absolute: 0.1 10*3/uL (ref 0.0–0.5)
Eosinophils Relative: 1 %
HCT: 38.4 % (ref 36.0–46.0)
Hemoglobin: 12.5 g/dL (ref 12.0–15.0)
Immature Granulocytes: 0 %
Lymphocytes Relative: 54 %
Lymphs Abs: 3.4 10*3/uL (ref 0.7–4.0)
MCH: 29.2 pg (ref 26.0–34.0)
MCHC: 32.6 g/dL (ref 30.0–36.0)
MCV: 89.7 fL (ref 80.0–100.0)
Monocytes Absolute: 0.4 10*3/uL (ref 0.1–1.0)
Monocytes Relative: 6 %
Neutro Abs: 2.4 10*3/uL (ref 1.7–7.7)
Neutrophils Relative %: 38 %
Platelets: 218 10*3/uL (ref 150–400)
RBC: 4.28 MIL/uL (ref 3.87–5.11)
RDW: 12 % (ref 11.5–15.5)
WBC: 6.2 10*3/uL (ref 4.0–10.5)
nRBC: 0 % (ref 0.0–0.2)

## 2018-11-23 LAB — BASIC METABOLIC PANEL
Anion gap: 10 (ref 5–15)
BUN: 14 mg/dL (ref 6–20)
CO2: 26 mmol/L (ref 22–32)
Calcium: 9.3 mg/dL (ref 8.9–10.3)
Chloride: 107 mmol/L (ref 98–111)
Creatinine, Ser: 0.86 mg/dL (ref 0.44–1.00)
GFR calc Af Amer: >60 mL/min (ref >60)
GFR calc non Af Amer: >60 mL/min (ref >60)
Glucose, Bld: 112 mg/dL — ABNORMAL HIGH (ref 70–99)
Potassium: 3.2 mmol/L — ABNORMAL LOW (ref 3.5–5.1)
Sodium: 143 mmol/L (ref 135–145)

## 2018-11-23 LAB — CBG MONITORING, ED: Glucose-Capillary: 94 mg/dL (ref 70–99)

## 2018-11-23 MED ORDER — LIDOCAINE-EPINEPHRINE (PF) 2 %-1:200000 IJ SOLN
20.0000 mL | Freq: Once | INTRAMUSCULAR | Status: AC
  Administered 2018-11-24: 20 mL
  Filled 2018-11-23: qty 20

## 2018-11-23 MED ORDER — SODIUM CHLORIDE 0.9 % IV BOLUS
1000.0000 mL | Freq: Once | INTRAVENOUS | Status: AC
  Administered 2018-11-23: 23:00:00 1000 mL via INTRAVENOUS

## 2018-11-23 MED ORDER — TETANUS-DIPHTH-ACELL PERTUSSIS 5-2.5-18.5 LF-MCG/0.5 IM SUSP
0.5000 mL | Freq: Once | INTRAMUSCULAR | Status: AC
  Administered 2018-11-24: 0.5 mL via INTRAMUSCULAR
  Filled 2018-11-23: qty 0.5

## 2018-11-23 NOTE — ED Provider Notes (Signed)
[facility] EMERGENCY DEPARTMENT Provider Note   CSN: [id number] Arrival date & time: 11/23/18  2230     History   Chief Complaint Chief Complaint  Patient presents with  . Laceration    HPI Shannon Robertson is a 58 y.o. female.     Patient presents to the ED with a chief complaint of laceration.  She was doing the dishes and a glass plate broke and cut her palm.  She was rushed back to an exam room after passing out while having her wound cleaned in triage.  She complains of pain her hand.  She denies any other injuries.  The history is provided by the patient. No language interpreter was used.    Past Medical History:  Diagnosis Date  . GERD (gastroesophageal reflux disease)   . INOMVEHM(094.7)     Patient Active Problem List   Diagnosis Date Noted  . ANXIETY DEPRESSION 08/19/2007  . SLEEPLESSNESS 08/19/2007  . MALAISE 08/19/2007  . HYPERBILIRUBINEMIA 08/19/2007  . LOSS OF APPETITE 08/19/2007  . NAUSEA AND VOMITING 08/19/2007  . ABDOMINAL PAIN, LEFT LOWER QUADRANT 08/19/2007    Past Surgical History:  Procedure Laterality Date  . CHOLECYSTECTOMY    . RECTAL ULTRASOUND N/A 12/12/2013   Procedure: RECTAL ULTRASOUND;  Surgeon: Arta Silence, MD;  Location: [facility];  Service: Endoscopy;  Laterality: N/A;     OB History   No obstetric history on file.      Home Medications    Prior to Admission medications   Medication Sig Start Date End Date Taking? Authorizing Provider  Multiple Vitamins-Minerals (HAIR/SKIN/NAILS PO) Take 1 tablet by mouth daily.    [provider]  Vitamin D, Ergocalciferol, (DRISDOL) 50000 UNITS CAPS capsule Take 50,000 Units by mouth every 7 (seven) days.    [provider]    Family History No family history on file.  Social History Social History   Tobacco Use  . Smoking status: Never Smoker  Substance Use Topics  . Alcohol use: No  . Drug use: No     Allergies   Patient has  no known allergies.   Review of Systems Review of Systems  All other systems reviewed and are negative.    Physical Exam Updated Vital Signs BP (!) 50/38   Pulse (!) 45   Temp 98.7 F (37.1 C) (Axillary)   Resp 14   SpO2 100%   Physical Exam Vitals signs and nursing note reviewed.  Constitutional:      General: She is not in acute distress.    Appearance: She is well-developed.  HENT:     Head: Normocephalic and atraumatic.  Eyes:     Conjunctiva/sclera: Conjunctivae normal.  Neck:     Musculoskeletal: Neck supple.  Cardiovascular:     Rate and Rhythm: Normal rate and regular rhythm.     Heart sounds: No murmur.  Pulmonary:     Effort: Pulmonary effort is normal. No respiratory distress.     Breath sounds: Normal breath sounds.  Abdominal:     Palpations: Abdomen is soft.     Tenderness: There is no abdominal tenderness.  Musculoskeletal: Normal range of motion.  Skin:    General: Skin is warm and dry.     Comments: 3 cm semi circular laceration to the right hypothenar eminence. No FB.  No visible tendon, bone, or nerve damage  Neurological:     Mental Status: She is alert and oriented to person, place, and time.  Psychiatric:  Mood and Affect: Mood normal.        Behavior: Behavior normal.        Thought Content: Thought content normal.        Judgment: Judgment normal.      ED Treatments / Results  Labs (all labs ordered are listed, but only abnormal results are displayed) Labs Reviewed  CBC WITH DIFFERENTIAL/PLATELET  BASIC METABOLIC PANEL  CBG MONITORING, ED    EKG None  Radiology No results found.  Procedures Procedures (including critical care time) LACERATION REPAIR Performed by: Montine Circle Authorized by: Montine Circle Consent: Verbal consent obtained. Risks and benefits: risks, benefits and alternatives were discussed Consent given by: patient Patient identity confirmed: provided demographic data Prepped and Draped in  normal sterile fashion Wound explored  Laceration Location: right palm  Laceration Length: 3 cm  No Foreign Bodies seen or palpated  Anesthesia: local infiltration  Local anesthetic: lidocaine 1% with epinephrine  Anesthetic total: 4 ml  Irrigation method: syringe Amount of cleaning: standard  Skin closure: 4-0 prolene  Number of sutures: 12  Technique: running  Patient tolerance: Patient tolerated the procedure well with no immediate complications.  Medications Ordered in ED Medications  sodium chloride 0.9 % bolus 1,000 mL (has no administration in time range)  lidocaine-EPINEPHrine (XYLOCAINE W/EPI) 2 %-1:200000 (PF) injection 20 mL (has no administration in time range)  Tdap (BOOSTRIX) injection 0.5 mL (has no administration in time range)     Initial Impression / Assessment and Plan / ED Course  I have reviewed the triage vital signs and the nursing notes.  Pertinent labs & imaging results that were available during my care of the patient were reviewed by me and considered in my medical decision making (see chart for details).        Patient with laceration to right palm.  No tendon or nerve injury.  No FB.  Syncopal event in triage while having wound cleaned.  Did not fall or injure self.  Tdap updated.    12:05 AM Feels improved.  Stable for discharge.  Final Clinical Impressions(s) / ED Diagnoses   Final diagnoses:  Laceration of right hand without foreign body, initial encounter    ED Discharge Orders    None       Montine Circle, PA-C 11/24/18 0006    Ripley Fraise, MD 11/24/18 [phone]

## 2018-11-23 NOTE — ED Triage Notes (Signed)
Laceration to palm of right hand. Pt was doing dishes and was cut by a dish. 90 degree angle - 2 in x 2in. Lac rinsed in triage and bandaged. Pt faint at the sight of blood - at home and in triage. Responded quickly. Pulses palpable distal to injury.

## 2018-11-23 NOTE — ED Notes (Addendum)
Per family at bedside, pt becomes lightheaded and passes out when she sees the smallest drop of blood. Pt minimally responsive at present.

## 2018-11-24 MED ORDER — ACETAMINOPHEN 500 MG PO TABS
1000.0000 mg | ORAL_TABLET | Freq: Once | ORAL | Status: AC
Start: 2018-11-24 — End: 2018-11-24
  Administered 2018-11-24: 1000 mg via ORAL
  Filled 2018-11-24: qty 2

## 2018-11-24 MED ORDER — POTASSIUM CHLORIDE CRYS ER 20 MEQ PO TBCR
40.0000 meq | EXTENDED_RELEASE_TABLET | Freq: Once | ORAL | Status: AC
  Administered 2018-11-24: 40 meq via ORAL
  Filled 2018-11-24: qty 2

## 2018-11-24 NOTE — ED Notes (Signed)
Pt ambulatory in hallway with husband; steady gait noted
